# Patient Record
Sex: Female | Born: 1940 | Race: White | Hispanic: No | Smoking: Never smoker
Health system: Southern US, Community
[De-identification: ages and names within clinical notes are randomized; demographics above are authoritative.]

## PROBLEM LIST (undated history)

## (undated) DIAGNOSIS — I10 Essential (primary) hypertension: Secondary | ICD-10-CM

---

## 2019-10-23 ENCOUNTER — Emergency Department (HOSPITAL_BASED_OUTPATIENT_CLINIC_OR_DEPARTMENT_OTHER)
Admission: EM | Admit: 2019-10-23 | Discharge: 2019-10-23 | Disposition: A | Discharge status: Home / Self Care | Payer: Medicare Other | Attending: Emergency Medicine | Admitting: Emergency Medicine

## 2019-10-23 ENCOUNTER — Encounter (HOSPITAL_BASED_OUTPATIENT_CLINIC_OR_DEPARTMENT_OTHER): Payer: Self-pay

## 2019-10-23 ENCOUNTER — Emergency Department (HOSPITAL_BASED_OUTPATIENT_CLINIC_OR_DEPARTMENT_OTHER): Payer: Medicare Other

## 2019-10-23 ENCOUNTER — Other Ambulatory Visit: Payer: Self-pay

## 2019-10-23 DIAGNOSIS — I10 Essential (primary) hypertension: Secondary | ICD-10-CM | POA: Diagnosis not present

## 2019-10-23 DIAGNOSIS — Z20822 Contact with and (suspected) exposure to covid-19: Secondary | ICD-10-CM | POA: Insufficient documentation

## 2019-10-23 DIAGNOSIS — R519 Headache, unspecified: Secondary | ICD-10-CM

## 2019-10-23 DIAGNOSIS — R079 Chest pain, unspecified: Secondary | ICD-10-CM

## 2019-10-23 DIAGNOSIS — K3189 Other diseases of stomach and duodenum: Secondary | ICD-10-CM

## 2019-10-23 DIAGNOSIS — R001 Bradycardia, unspecified: Secondary | ICD-10-CM | POA: Diagnosis not present

## 2019-10-23 DIAGNOSIS — R112 Nausea with vomiting, unspecified: Secondary | ICD-10-CM | POA: Insufficient documentation

## 2019-10-23 DIAGNOSIS — R1031 Right lower quadrant pain: Secondary | ICD-10-CM | POA: Diagnosis not present

## 2019-10-23 DIAGNOSIS — R0602 Shortness of breath: Secondary | ICD-10-CM

## 2019-10-23 HISTORY — DX: Essential (primary) hypertension: I10

## 2019-10-23 LAB — URINALYSIS, ROUTINE W REFLEX MICROSCOPIC
Bilirubin Urine: NEGATIVE
Glucose, UA: >=500 mg/dL — AB
Hgb urine dipstick: NEGATIVE
Ketones, ur: NEGATIVE mg/dL
Leukocytes,Ua: NEGATIVE
Nitrite: NEGATIVE
Protein, ur: NEGATIVE mg/dL
Specific Gravity, Urine: 1.02 (ref 1.005–1.030)
pH: 5.5 (ref 5.0–8.0)

## 2019-10-23 LAB — COMPREHENSIVE METABOLIC PANEL
ALT: 13 U/L (ref 0–44)
AST: 19 U/L (ref 15–41)
Albumin: 4.4 g/dL (ref 3.5–5.0)
Alkaline Phosphatase: 70 U/L (ref 38–126)
Anion gap: 16 — ABNORMAL HIGH (ref 5–15)
BUN: 21 mg/dL (ref 8–23)
CO2: 27 mmol/L (ref 22–32)
Calcium: 9.5 mg/dL (ref 8.9–10.3)
Chloride: 91 mmol/L — ABNORMAL LOW (ref 98–111)
Creatinine, Ser: 0.79 mg/dL (ref 0.44–1.00)
GFR calc Af Amer: >60 mL/min (ref >60)
GFR calc non Af Amer: >60 mL/min (ref >60)
Glucose, Bld: 154 mg/dL — ABNORMAL HIGH (ref 70–99)
Potassium: 3.3 mmol/L — ABNORMAL LOW (ref 3.5–5.1)
Sodium: 134 mmol/L — ABNORMAL LOW (ref 135–145)
Total Bilirubin: 1.1 mg/dL (ref 0.3–1.2)
Total Protein: 8 g/dL (ref 6.5–8.1)

## 2019-10-23 LAB — CBC WITH DIFFERENTIAL/PLATELET
Abs Immature Granulocytes: 0.01 10*3/uL (ref 0.00–0.07)
Basophils Absolute: 0 10*3/uL (ref 0.0–0.1)
Basophils Relative: 1 %
Eosinophils Absolute: 0.1 10*3/uL (ref 0.0–0.5)
Eosinophils Relative: 1 %
HCT: 45.5 % (ref 36.0–46.0)
Hemoglobin: 15.7 g/dL — ABNORMAL HIGH (ref 12.0–15.0)
Immature Granulocytes: 0 %
Lymphocytes Relative: 21 %
Lymphs Abs: 1.4 10*3/uL (ref 0.7–4.0)
MCH: 31 pg (ref 26.0–34.0)
MCHC: 34.5 g/dL (ref 30.0–36.0)
MCV: 89.9 fL (ref 80.0–100.0)
Monocytes Absolute: 0.5 10*3/uL (ref 0.1–1.0)
Monocytes Relative: 7 %
Neutro Abs: 4.6 10*3/uL (ref 1.7–7.7)
Neutrophils Relative %: 70 %
Platelets: 205 10*3/uL (ref 150–400)
RBC: 5.06 MIL/uL (ref 3.87–5.11)
RDW: 13 % (ref 11.5–15.5)
WBC: 6.6 10*3/uL (ref 4.0–10.5)
nRBC: 0 % (ref 0.0–0.2)

## 2019-10-23 LAB — URINALYSIS, MICROSCOPIC (REFLEX)
Bacteria, UA: NONE SEEN
Squamous Epithelial / HPF: NONE SEEN (ref 0–5)
WBC, UA: NONE SEEN WBC/hpf (ref 0–5)

## 2019-10-23 LAB — TROPONIN I (HIGH SENSITIVITY): Troponin I (High Sensitivity): 7 ng/L (ref <18)

## 2019-10-23 LAB — SARS CORONAVIRUS 2 BY RT PCR (HOSPITAL ORDER, PERFORMED IN ~~LOC~~ HOSPITAL LAB): SARS Coronavirus 2: NEGATIVE

## 2019-10-23 LAB — TSH: TSH: 0.685 u[IU]/mL (ref 0.350–4.500)

## 2019-10-23 MED ORDER — ONDANSETRON HCL 4 MG PO TABS: 4.0000 mg | ORAL_TABLET | Freq: Three times a day (TID) | ORAL | 0 refills | Status: AC | PRN

## 2019-10-23 MED ORDER — IOHEXOL 300 MG/ML  SOLN
100.0000 mL | Freq: Once | INTRAMUSCULAR | Status: AC
  Administered 2019-10-23: 100 mL via INTRAVENOUS

## 2019-10-23 NOTE — Discharge Instructions (Addendum)
You are seen in the emergency department for evaluation of multiple complaints including headache pain in your chest nausea vomiting and abdominal pain.  You had a CAT scan of your head and abdomen which showed some thickening of your stomach, possibly gastritis.  Your lab work was unremarkable.  Your heart rate was in the 50s and this should get followed up with cardiology.  Also you should follow up with GI regarding the stomach thickening.  We are prescribing you some Zofran to help with nausea.  Please return the emergency department if any worsening or concerning symptoms.  You also requested a Covid test.  You should isolate until results of your test her back.  If you test positive you should isolate up to 2 weeks.

## 2019-10-23 NOTE — ED Notes (Signed)
ED Provider at bedside. 

## 2019-10-23 NOTE — ED Provider Notes (Signed)
[facility] Provider Note   CSN: [id number] Arrival date & time: 10/23/19  1404     History Chief Complaint  Patient presents with  . Multiple c/o    Brittany Knight is a 79 y.o. female.  The history is provided by the patient and medical records. No language interpreter was used.   Brittany Knight is a 79 y.o. female who presents to the Emergency Department complaining of multiple complaints.  She complains of right sided headache.  HA is described as dull pain in her head to her eye.  Not sudden in onset.  Not worse headache of her life.  She has some left sided chest pain that started a few days ago.  Pain is waxing and waning, nonradiating.  No clear alleviating or worsening factors.  She has RLQ abdominal pain that started Sunday morning.  Pain is described as waxing and waning, sharp in nature.  Subjective fever per family.  She had chills two days ago.  Has nausea and dry heaves.  +diarrhea. + poor appetite.  No known COVID 19 exposures.  Lives alone.  Has received COVID19 vaccine.    Denies vomiting, constipation, cough, sob.     Past Medical History:  Diagnosis Date  . Hypertension     There are no problems to display for this patient.      OB History   No obstetric history on file.     No family history on file.  Social History   Tobacco Use  . Smoking status: Never Smoker  . Smokeless tobacco: Never Used  Substance Use Topics  . Alcohol use: Never  . Drug use: Never    Home Medications Prior to Admission medications   Not on File    Allergies    Codeine  Review of Systems   Review of Systems  All other systems reviewed and are negative.   Physical Exam Updated Vital Signs BP (!) 185/57 (BP Location: Right Arm)   Pulse (!) 45   Temp 97.6 F (36.4 C) (Oral)   Resp 18   Wt 75.8 kg   SpO2 98%   Physical Exam Vitals and nursing note reviewed.  Constitutional:      Appearance: She is well-developed.  HENT:     Head: Normocephalic and atraumatic.     Comments: No temporal tenderness to palpation Eyes:     Extraocular Movements: Extraocular movements intact.     Pupils: Pupils are equal, round, and reactive to light.  Cardiovascular:     Rate and Rhythm: Regular rhythm. Bradycardia present.     Heart sounds: No murmur.  Pulmonary:     Effort: Pulmonary effort is normal. No respiratory distress.     Breath sounds: Normal breath sounds.  Abdominal:     Palpations: Abdomen is soft.     Tenderness: There is no abdominal tenderness. There is no guarding or rebound.  Musculoskeletal:        General: No tenderness.     Comments: 2+ DP pulses bilaterally.  Trace lower extremity edema  Skin:    General: Skin is warm and dry.  Neurological:     Mental Status: She is alert and oriented to person, place, and time.     Comments: No asymmetry of facial movements.  Visual fields grossly intact  5/5 strength in all four extremities with sensation to light touch intact in all four extremities.    Psychiatric:        Behavior: Behavior  normal.     ED Results / Procedures / Treatments   Labs (all labs ordered are listed, but only abnormal results are displayed) Labs Reviewed - No data to display  EKG EKG Interpretation  Date/Time:  Wednesday Oct 23 2019 14:13:46 EDT Ventricular Rate:  44 PR Interval:  156 QRS Duration: 80 QT Interval:  502 QTC Calculation: 429 R Axis:   -6 Text Interpretation: Marked sinus bradycardia Abnormal ECG no prior available for comparison Confirmed by Tilden Fossa [id number]) on 10/23/2019 2:28:07 PM   Radiology No results found.  Procedures Procedures (including critical care time)  Medications Ordered in ED Medications - No data to display  ED Course  I have reviewed the triage vital signs and the nursing notes.  Pertinent labs & imaging results that were available during my care of the patient were reviewed by me and considered in my medical decision making  (see chart for details).    MDM Rules/Calculators/A&P                     Patient here for evaluation of headache, chest pain, abdominal pain. She is noted to be bradycardia on evaluation but in no acute distress. She has no focal neurologic deficits. Patient care transferred pending further workup, labs.  Final Clinical Impression(s) / ED Diagnoses Final diagnoses:  None    Rx / DC Orders ED Discharge Orders    None       Tilden Fossa, MD 10/23/19 1607

## 2019-10-23 NOTE — ED Triage Notes (Addendum)
Pt c/o nausea, HA to right temporal, fatigue, right side abd pain, intermittent CP and bilat UE pain-sx started 4/15-NAD-to triage in w/c

## 2019-10-23 NOTE — ED Notes (Signed)
Pt on monitor 

## 2019-10-23 NOTE — ED Provider Notes (Signed)
Signout from Dr. Madilyn Hook.  Vague symptoms of of right-sided headache, fatigue, right-sided abdominal pain, intermittent left chest pain.  Plan is to follow-up on labs and get CT abdomen and pelvis.  Disposition per results of testing and patient improvement. Physical Exam  BP (!) 146/44   Pulse (!) 45   Temp 97.6 F (36.4 C) (Oral)   Resp 11   Wt 75.8 kg   SpO2 96%   Physical Exam  ED Course/Procedures     Procedures  MDM  I reviewed the results of the patient's labs with her and her daughter.  I also let her know about the CT results of her head and abdomen.  She does not appear to be on any beta-blocker and her heart rate spent in the 50s but asymptomatic.  Denies any fainting spells.  She also was on a PPI.  So will refer to cardiology for the bradycardia and GI for her gastric thickening.  Will prescribe some Zofran to help her with her nausea.  Daughter asked if we could do a Covid swab.       Terrilee Files, MD 10/24/19 [phone]

## 2019-10-24 LAB — URINE CULTURE

## 2019-10-25 ENCOUNTER — Encounter: Payer: Self-pay | Admitting: Gastroenterology

## 2019-11-07 ENCOUNTER — Ambulatory Visit: Payer: Medicare Other | Admitting: Gastroenterology

## 2021-11-07 IMAGING — CT CT HEAD W/O CM
3 series · 16 of 47 positions shown, 19 images · non-contrast
Comparison: None.

CLINICAL DATA: Headache

EXAM:
CT HEAD WITHOUT CONTRAST
TECHNIQUE: Contiguous axial images were obtained from the base of the skull
through the vertex without intravenous contrast.

[Series 2: head wo · axial · 0.42mm/px · z∈[-177,-47]mm · 10 of 32 slices shown, 13 images]
[im 3/32  brain]
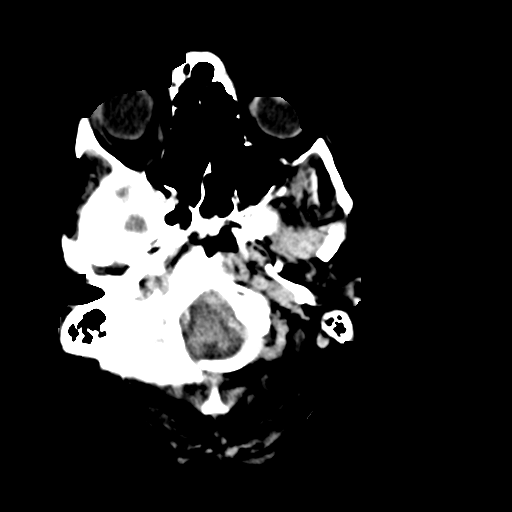
[im 3/32  bone]
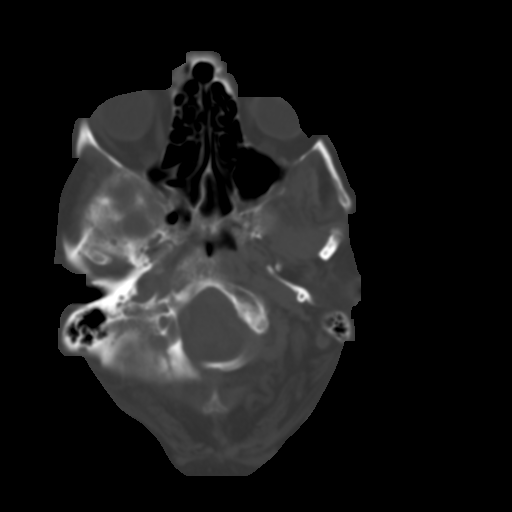
[im 6/32  brain]
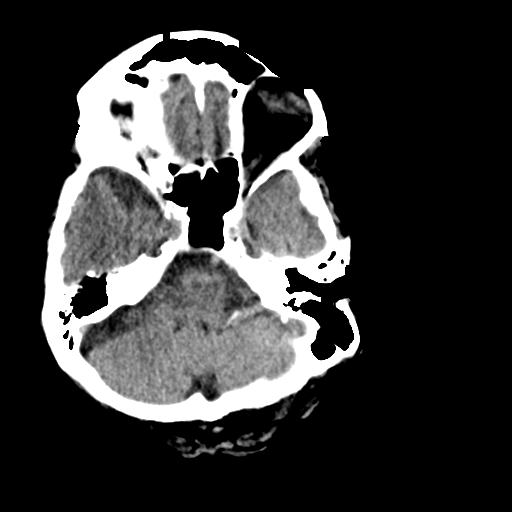
[im 9/32  brain]
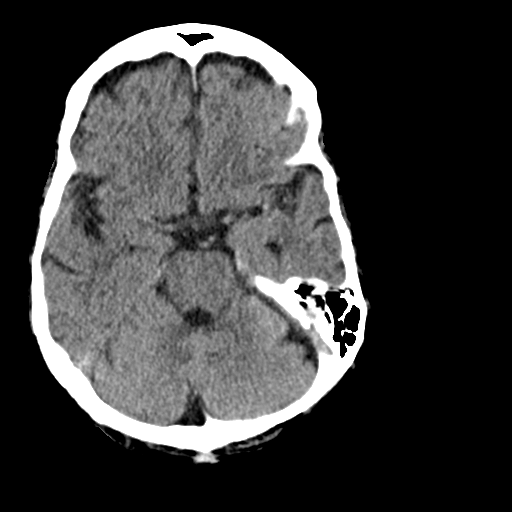
[im 11/32  brain]
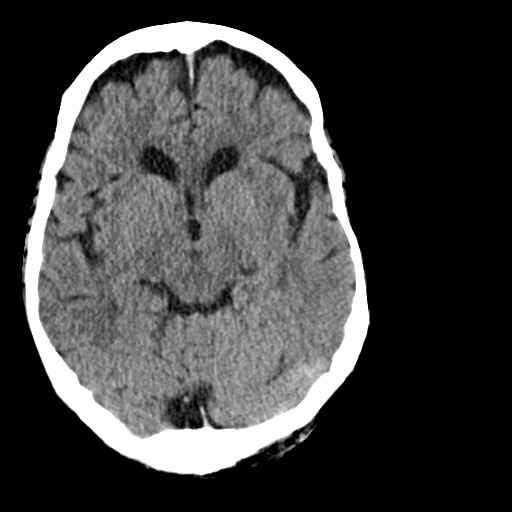
[im 14/32  brain]
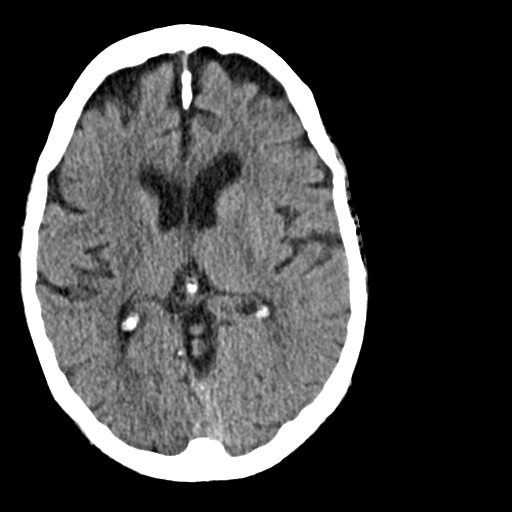
[im 14/32  bone]
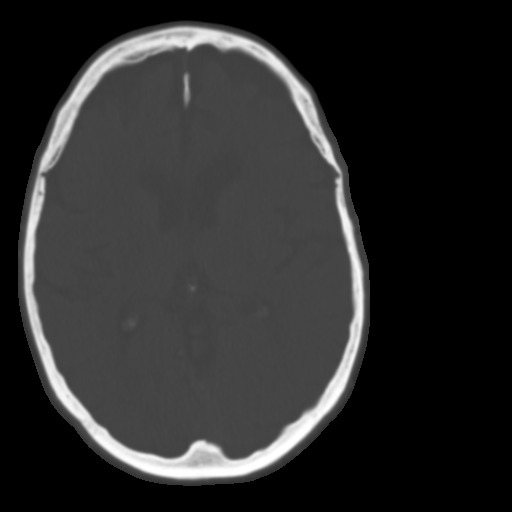
[im 18/32  brain]
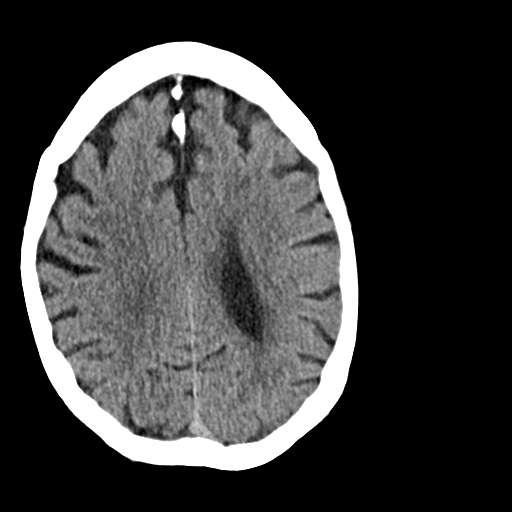
[im 21/32  brain]
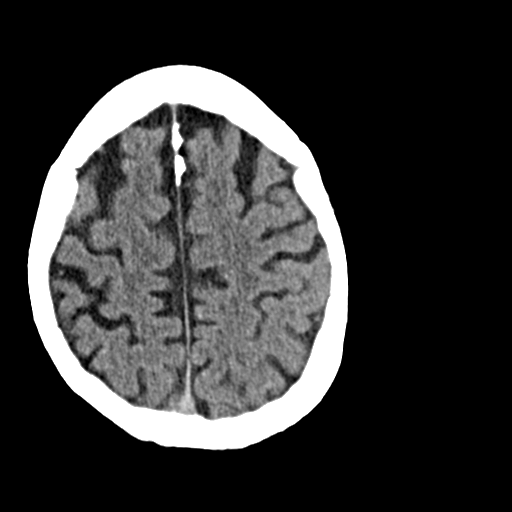
[im 24/32  brain]
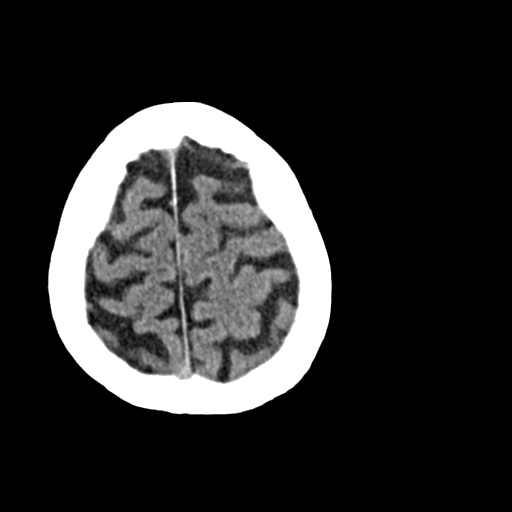
[im 26/32  brain]
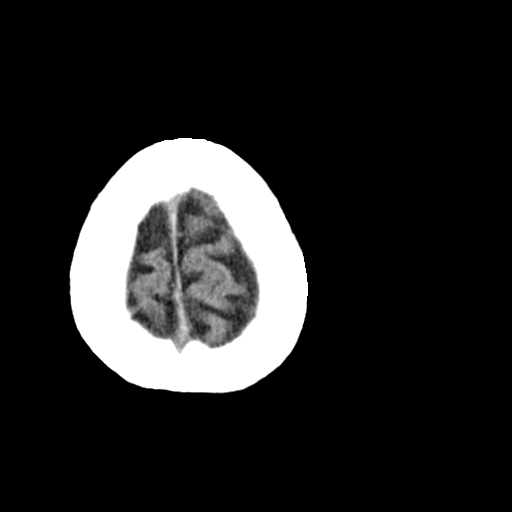
[im 26/32  bone]
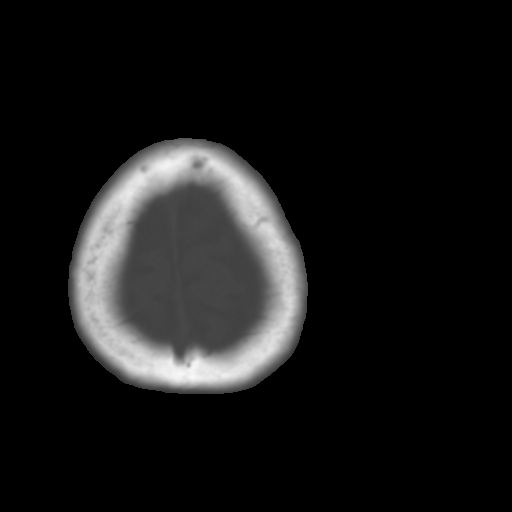
[im 29/32  brain]
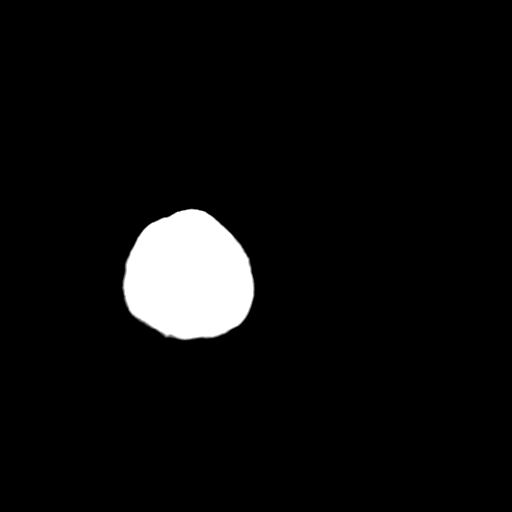

[Series 4: coronal soft · coronal · 0.31mm/px · 3 of 70 slices shown]
[im 24/70  brain]
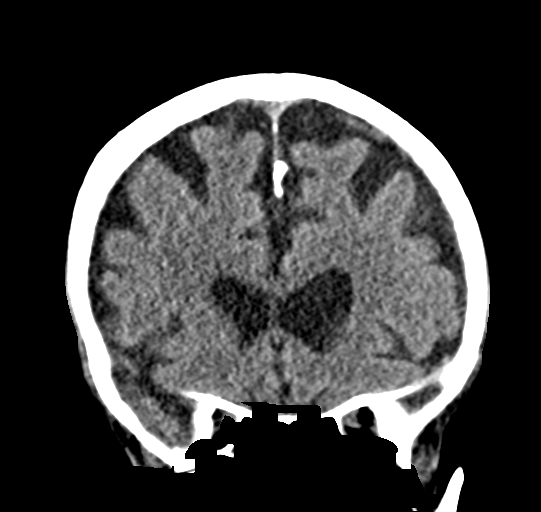
[im 31/70  brain]
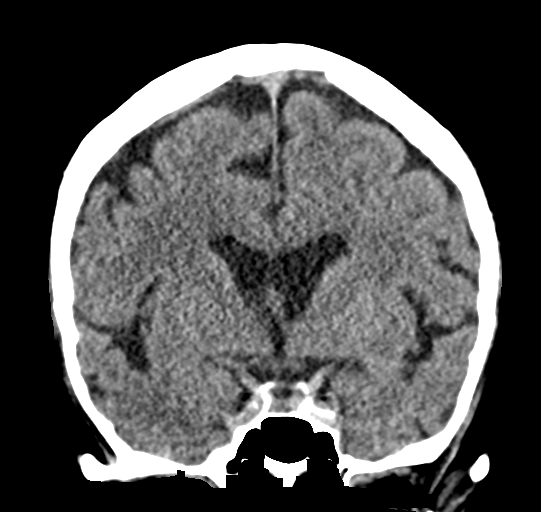
[im 39/70  brain]
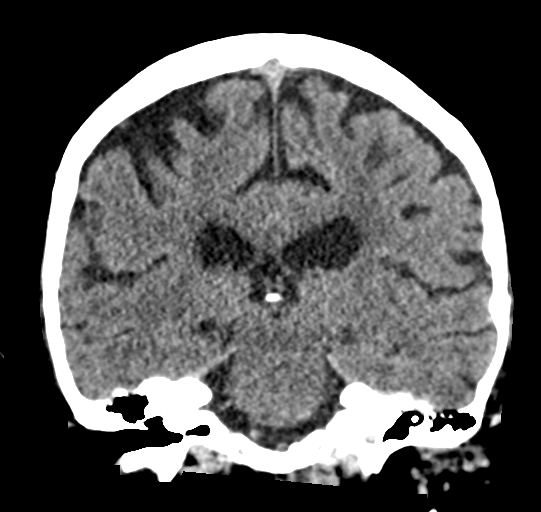

[Series 5: sag soft · sagittal · 0.32mm/px · 3 of 55 slices shown]
[im 19/55  brain]
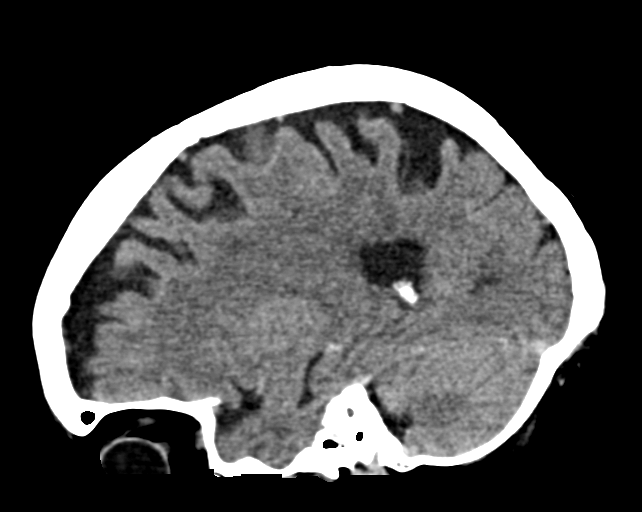
[im 28/55  brain]
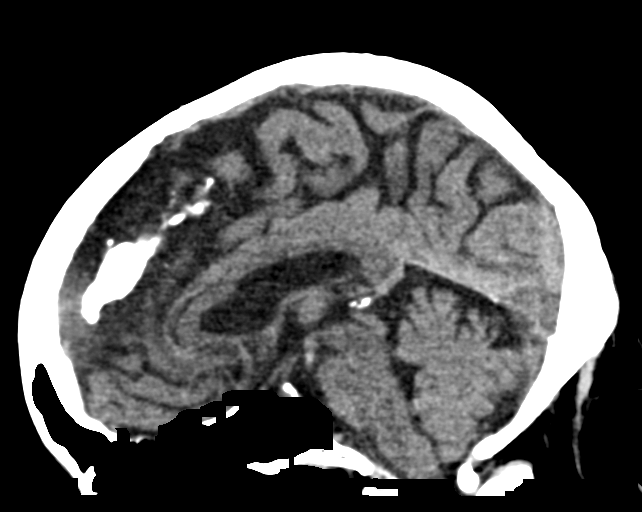
[im 37/55  brain]
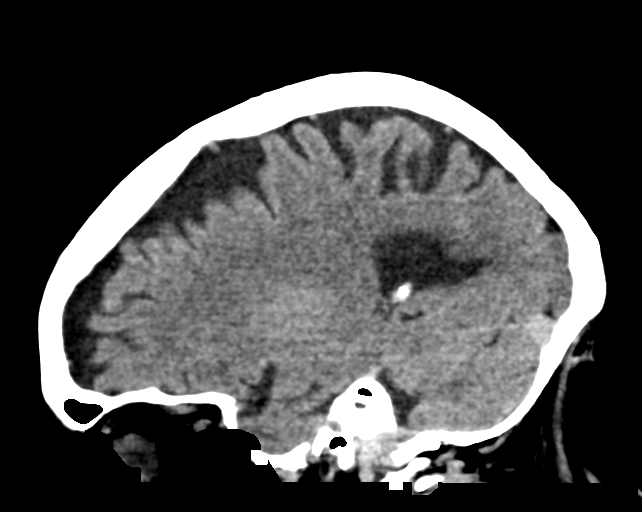

[16 of 47 positions shown; findings below may reference images not displayed]

FINDINGS: Brain: No evidence of acute infarction, hemorrhage, hydrocephalus,
extra-axial collection or mass lesion/mass effect. Scattered
low-density changes within the periventricular and subcortical white
matter compatible with chronic microvascular ischemic change. Mild
diffuse cerebral volume loss.

Vascular: Mild atherosclerotic calcifications involving the large
vessels of the skull base. No unexpected hyperdense vessel.

Skull: Normal. Negative for fracture or focal lesion.

Sinuses/Orbits: No acute finding.

Other: None.
IMPRESSION: 1.  No acute intracranial findings.

2.  Chronic microvascular ischemic change and cerebral volume loss.

## 2021-11-07 IMAGING — CT CT ABD-PELV W/ CM
2 of 5 series · 16 of 46 positions shown, 18 images · IV contrast (Omnipaque)
Comparison: Report of renal ultrasound 12/13/2013 (no images
available).

CLINICAL DATA: 78-year-old female with right side abdominal pain,
nausea.

EXAM:
CT ABDOMEN AND PELVIS WITH CONTRAST
TECHNIQUE: Multidetector CT imaging of the abdomen and pelvis was performed
using the standard protocol following bolus administration of
intravenous contrast.
CONTRAST:  100mL OMNIPAQUE IOHEXOL 300 MG/ML  SOLN

[Series 2: axial st · axial · 0.71mm/px · z∈[-455,-95]mm · 13 of 82 slices shown, 15 images]
[im 5/82  soft-tissue]
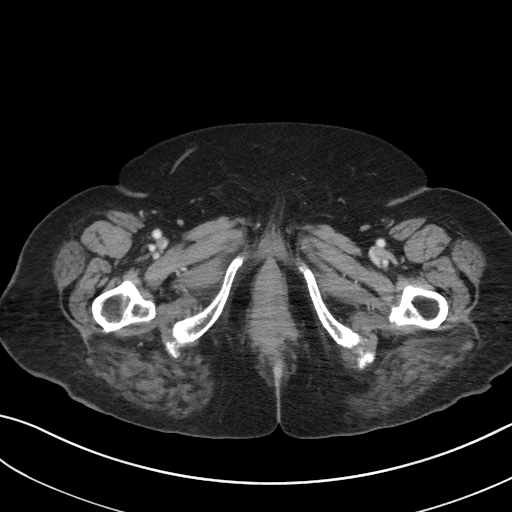
[im 5/82  bone]
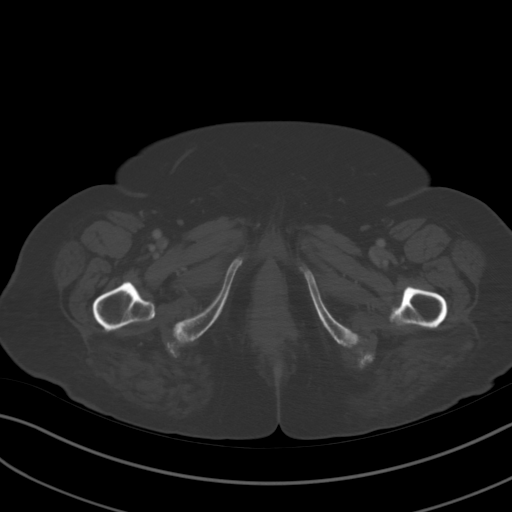
[im 10/82  soft-tissue]
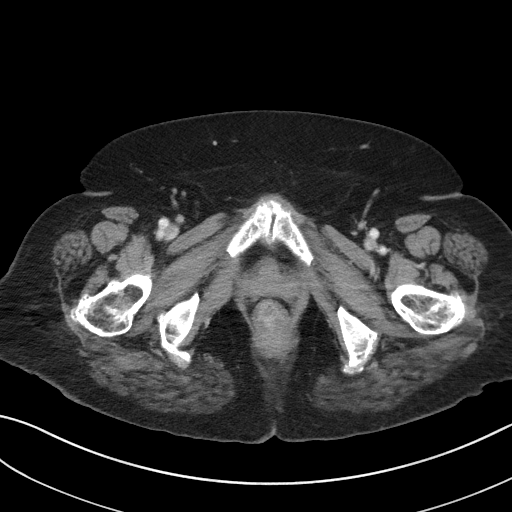
[im 19/82  soft-tissue]
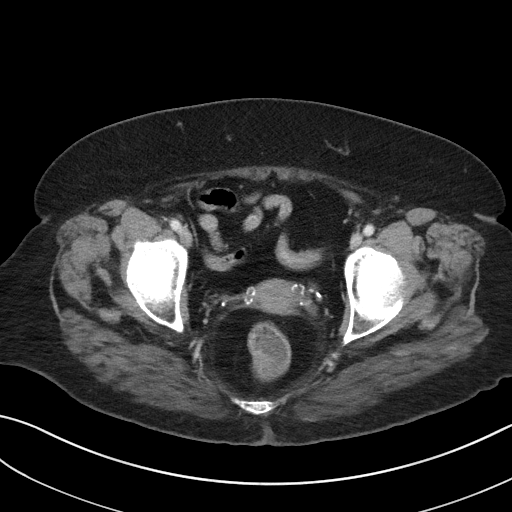
[im 23/82  soft-tissue]
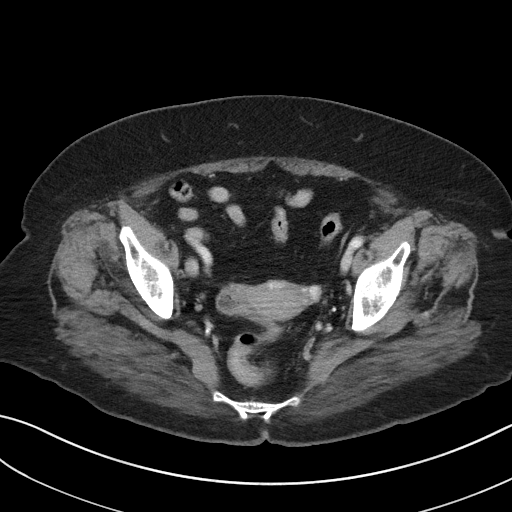
[im 28/82  soft-tissue]
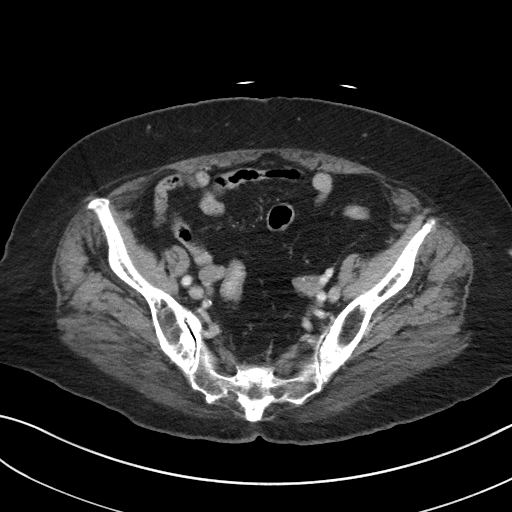
[im 37/82  soft-tissue]
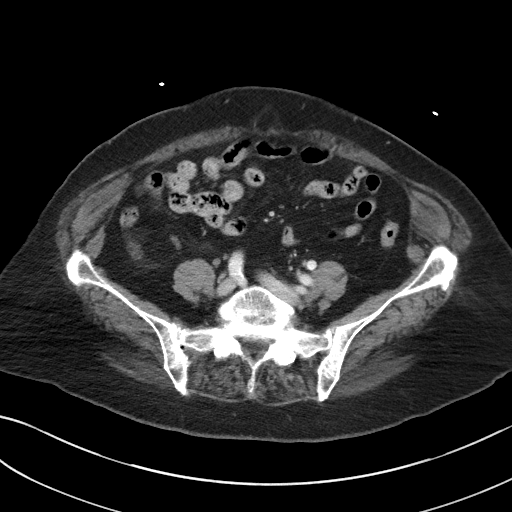
[im 41/82  soft-tissue]
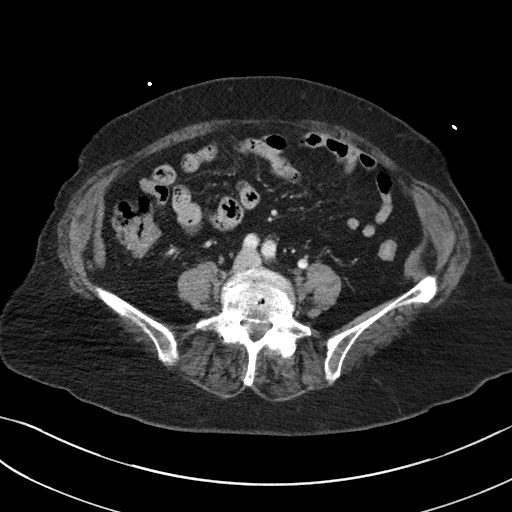
[im 46/82  soft-tissue]
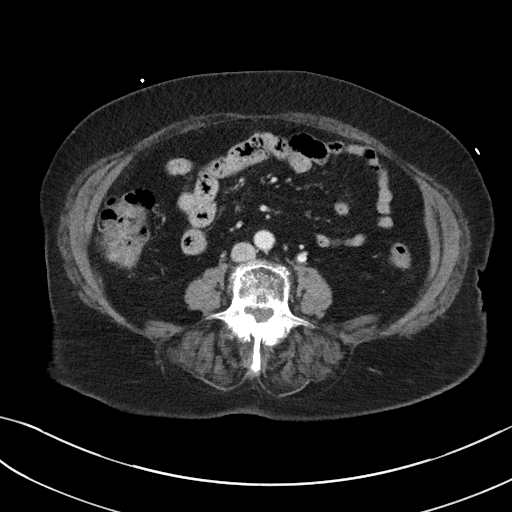
[im 55/82  soft-tissue]
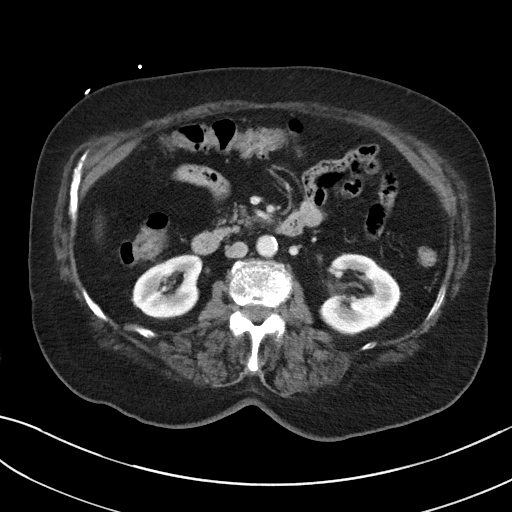
[im 55/82  bone]
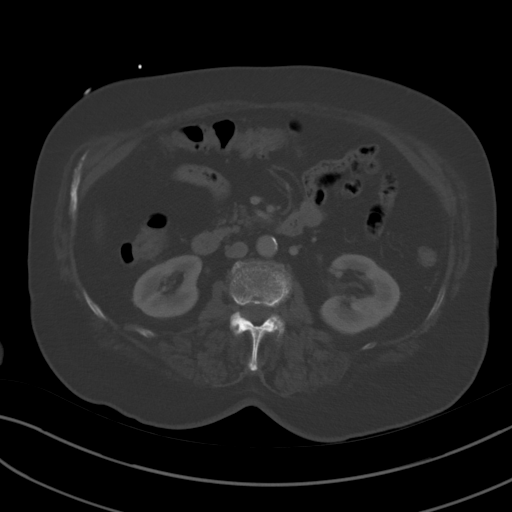
[im 59/82  soft-tissue]
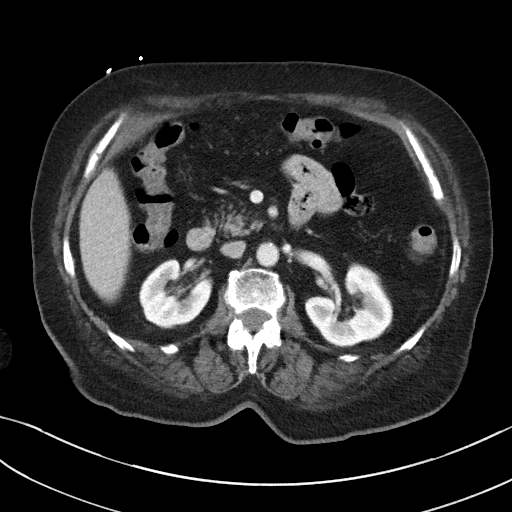
[im 64/82  soft-tissue]
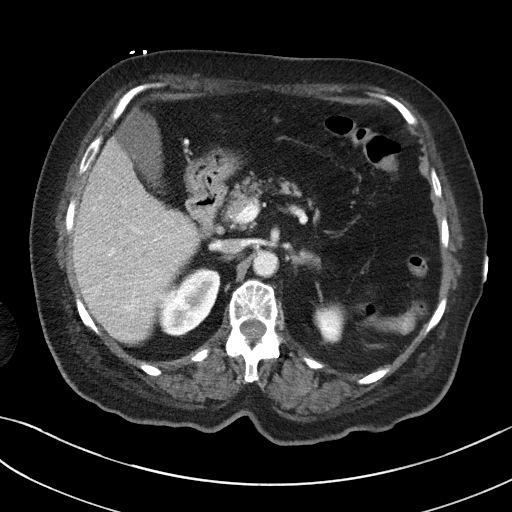
[im 73/82  soft-tissue]
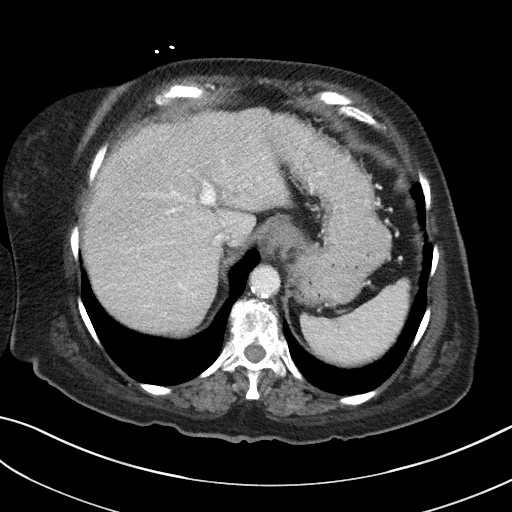
[im 77/82  soft-tissue]
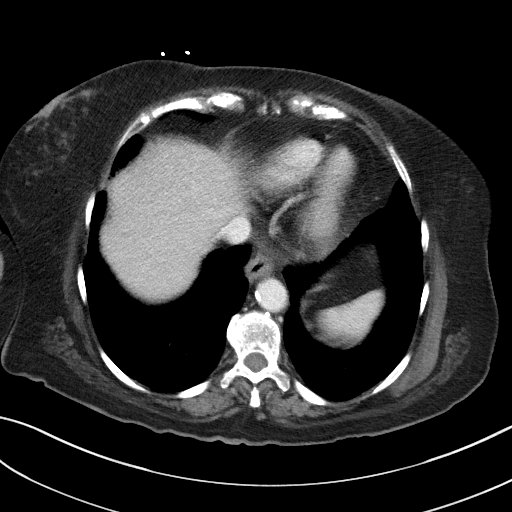

[Series 5: coronal st · coronal · 0.69mm/px · 3 of 101 slices shown]
[im 34/101  soft-tissue]
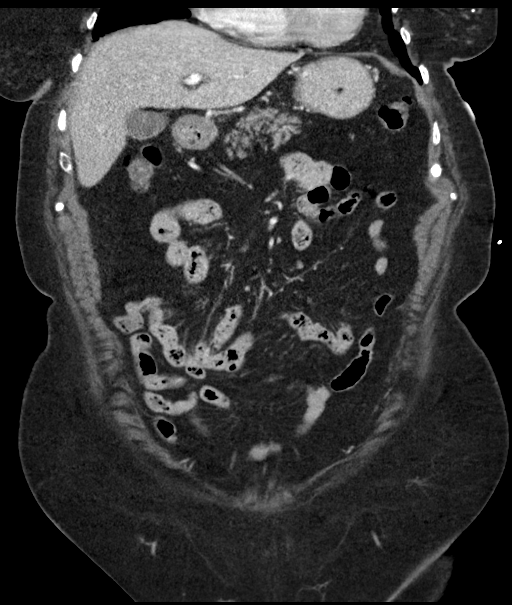
[im 45/101  soft-tissue]
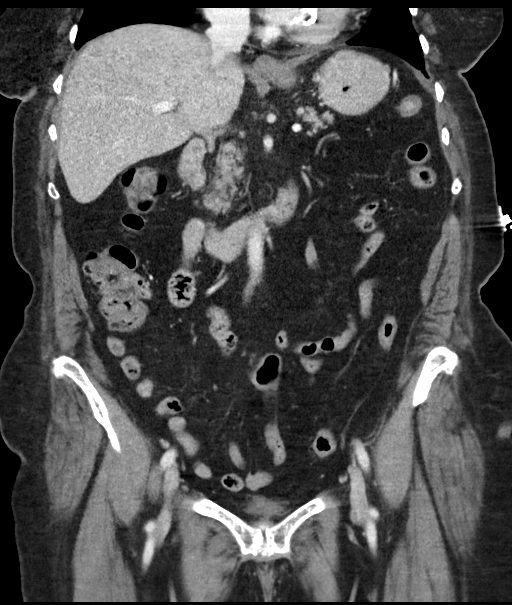
[im 56/101  soft-tissue]
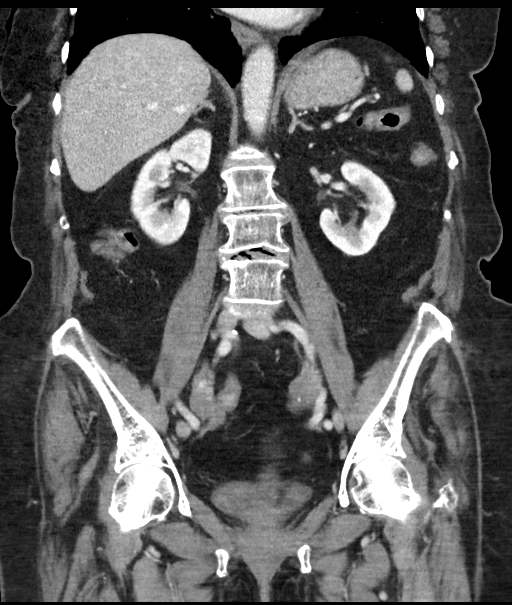

[16 of 46 positions shown; findings below may reference images not displayed]

FINDINGS: Lower chest: Cardiac size at the upper limits of normal. Negative
lung bases. No pericardial or pleural effusion.

Hepatobiliary: Negative liver and gallbladder. No bile duct
enlargement.

Pancreas: Negative aside from fatty atrophy.

Spleen: Negative.

Adrenals/Urinary Tract: Normal adrenal glands.

Left renal upper pole cortical scarring and small benign appearing
cyst. Renal enhancement and contrast excretion otherwise symmetric
and normal. Normal proximal ureters. No nephrolithiasis.
Unremarkable distal ureters. Occasional pelvic phleboliths.
Completely decompressed urinary bladder.

Stomach/Bowel: No dilated large or small bowel. Somewhat redundant
but mostly decompressed large bowel. Normal appendix visible on
coronal image 47. Negative terminal ileum. No large or small bowel
inflammation identified. There is a small 3 cm fat containing
umbilical hernia (sagittal image 58) with no evidence of
incarceration. The stomach is decompressed, but with conspicuous
circumferential wall thickening (series 2, image 12). Although there
is no perigastric inflammatory stranding. The duodenum appears
normal.

No free air or free fluid.

Vascular/Lymphatic: Major arterial structures are patent. Mild for
age Aortoiliac calcified atherosclerosis. Portal venous system is
patent. No lymphadenopathy.

Reproductive: Negative.

Other: No pelvic free fluid.

Musculoskeletal: Advanced lumbar spine degeneration. There is a
degree of osteopenia. No acute osseous abnormality identified.
IMPRESSION: 1. Generalized gastric wall thickening, which seems disproportionate
to the decompressed state of the stomach, and is suspicious for
Gastritis. No perigastric inflammation.
EGD would likely be the most valuable next step.
2. No other abnormal bowel. Normal appendix. No acute or
inflammatory process identified in the abdomen or pelvis.
3. Small fat containing umbilical hernia with no evidence of
incarceration.
4. Advanced lumbar spine degeneration.
5. Aortic Atherosclerosis ([id number]).
